# Patient Record
Sex: Male | Born: 1995 | Race: White | Hispanic: No | Marital: Married | Smoking: Never smoker
Health system: Southern US, Community
[De-identification: ages and names within clinical notes are randomized; demographics above are authoritative.]

## PROBLEM LIST (undated history)

## (undated) DIAGNOSIS — Z9109 Other allergy status, other than to drugs and biological substances: Secondary | ICD-10-CM

## (undated) DIAGNOSIS — G43909 Migraine, unspecified, not intractable, without status migrainosus: Secondary | ICD-10-CM

## (undated) HISTORY — PX: ADENOIDECTOMY: SHX5191

---
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
## 2018-12-30 ENCOUNTER — Other Ambulatory Visit: Payer: Self-pay

## 2018-12-30 DIAGNOSIS — Z20822 Contact with and (suspected) exposure to covid-19: Secondary | ICD-10-CM

## 2019-01-01 LAB — NOVEL CORONAVIRUS, NAA: SARS-CoV-2, NAA: NOT DETECTED

## 2019-05-31 ENCOUNTER — Telehealth: Payer: Self-pay | Admitting: Internal Medicine

## 2019-05-31 NOTE — Telephone Encounter (Signed)
Patient's Grandfather called today He stated that the patient is needing to est care with a pcp. Patient is having ear pain and would like to be worked in as soon as he could.   Could either of you work the patient in? Right now it looks like next opening is about the 2nd - 3rd week in April

## 2019-05-31 NOTE — Telephone Encounter (Signed)
Okay to put him in anytime this week.

## 2019-06-03 ENCOUNTER — Ambulatory Visit (INDEPENDENT_AMBULATORY_CARE_PROVIDER_SITE_OTHER): Payer: Managed Care, Other (non HMO) | Admitting: Primary Care

## 2019-06-03 ENCOUNTER — Encounter: Payer: Self-pay | Admitting: Primary Care

## 2019-06-03 ENCOUNTER — Other Ambulatory Visit: Payer: Self-pay

## 2019-06-03 VITALS — BP 106/70 | HR 71 | Temp 97.1°F | Ht 70.75 in | Wt 138.5 lb

## 2019-06-03 DIAGNOSIS — H9202 Otalgia, left ear: Secondary | ICD-10-CM | POA: Diagnosis not present

## 2019-06-03 DIAGNOSIS — G43709 Chronic migraine without aura, not intractable, without status migrainosus: Secondary | ICD-10-CM

## 2019-06-03 DIAGNOSIS — G43909 Migraine, unspecified, not intractable, without status migrainosus: Secondary | ICD-10-CM | POA: Insufficient documentation

## 2019-06-03 MED ORDER — FLUTICASONE PROPIONATE 50 MCG/ACT NA SUSP: 1.0000 | Freq: Two times a day (BID) | NASAL | 0 refills | Status: DC

## 2019-06-03 NOTE — Assessment & Plan Note (Signed)
Chronic, occurs several times monthly. Resolve with Excedrin Migraine. Offered preventative treatment for which he kindly declines. He will update.

## 2019-06-03 NOTE — Assessment & Plan Note (Signed)
Acute for the last 3 weeks. Exam today without evidence of infection, cerumen impaction, ruptured TM. Cloudy appearance, likely more allergy related.  Discussed use of Flonase, Zyrtec. He will update.  Consider ENT evaluation if no improvement.

## 2019-06-03 NOTE — Patient Instructions (Signed)
Nasal Congestion/Ear Pressure/Sinus Pressure: Try using Flonase (fluticasone) nasal spray. Instill 1 spray in each nostril twice daily.   Start Zyrtec once daily for throat drainage/mucous production.  It was a pleasure to meet you today! Please don't hesitate to call or message me with any questions. Welcome to [facility]!
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
## 2019-09-05 ENCOUNTER — Encounter: Payer: Managed Care, Other (non HMO) | Admitting: Primary Care

## 2020-03-31 ENCOUNTER — Telehealth: Payer: Managed Care, Other (non HMO) | Admitting: Nurse Practitioner

## 2020-04-02 ENCOUNTER — Telehealth: Payer: Self-pay

## 2020-04-02 NOTE — Telephone Encounter (Signed)
Called and spoke with patient who stated that he went and got a covid test, which was negative. Patient states that he is feeling much better now and is not having any symptoms. Informed patient to call office back if he needs any further assistance. Patient verbalized understanding.

## 2020-04-02 NOTE — Telephone Encounter (Signed)
Noted, glad to know he's doing better. 

## 2020-04-02 NOTE — Telephone Encounter (Signed)
[facility] Night - Client TELEPHONE ADVICE RECORD [facility] Patient Name: Ryan Potter Gender: Male DOB: Oct 10, 1995 Age: 25 Y 9 M 16 D Return Phone Number: [phone] (Primary) Address[place] [address] Client [facility] Night - Client Client Site [facility] - Night Physician Tillman Abide- MD Contact Type Call Who Is Calling Patient / Member / Family / Caregiver Call Type Triage / Clinical Return Phone Number [phone] (Primary) Chief Complaint Headache Reason for Call Medication Question / Request Initial Comment Caller states his grandson has Sx of fever of 102, body aches, nausea, headache and fatigue. Nurse Assessment Nurse: Annye English RN, Angelique Blonder Date/Time (Eastern Time): 03/31/2020 9:11:33 AM Confirm and document reason for call. If symptomatic, describe symptoms. ---Caller states his grandson has Sx of fever of 102, body aches, nausea, headache and fatigue. Took Tylenol 15 mins ago. Does the patient have any new or worsening symptoms? ---Yes Will a triage be completed? ---Yes Related visit to physician within the last 2 weeks? ---No Does the PT have any chronic conditions? (i.e. diabetes, asthma, this includes High risk factors for pregnancy, etc.) ---No Is this a behavioral health or substance abuse call? ---No Guidelines Guideline Title Affirmed Question Affirmed Notes Nurse Date/Time (Eastern Time) COVID-19 - Diagnosed or Suspected [1] COVID-19 infection suspected by caller or triager AND [2] mild symptoms (cough, fever, or others) AND [3] has not gotten tested yet Annye English, RN, [facility] 03/31/2020 9:12:25 AM Disp. Time Lamount Cohen Time) Disposition Final User 03/31/2020 9:18:04 AM Call PCP when Office is Open Yes Carmon, RN, Leighton Ruff Disagree/Comply Comply Caller Understands Yes PreDisposition Call Doctor Care Advice Given Per Guideline PLEASE NOTE: All  timestamps contained within this report are represented as [place] Standard Time. CONFIDENTIALTY NOTICE: This fax transmission is intended only for the addressee. It contains information that is legally privileged, confidential or otherwise protected from use or disclosure. If you are not the intended recipient, you are strictly prohibited from reviewing, disclosing, copying using or disseminating any of this information or taking any action in reliance on or regarding this information. If you have received this fax in error, please notify us immediately by telephone so that we can arrange for its return to [place]. Phone: [phone], Toll-Free: [phone], Fax: [phone] Page: 2 of 2 Call Id: [id number] Care Advice Given Per Guideline CALL PCP WHEN OFFICE IS OPEN: ALTERNATE DISPOSITION - LOCAL CLINIC OR URGENT CARE CENTER: * Many clinics, retail clinics (such as [facility] or [facility]), and urgent care centers perform COVID-19 testing. COVID-19 - WHO NEEDS TESTING? * SYMPTOMS: All people who have symptoms of COVID-19 should get tested WITHIN 3 DAYS of becoming ill. * Your doctor (or NP/PA) can order a COVID-19 test for you. COVID-19 - WHERE TO GO FOR TESTING? * Many clinics, retail clinics (such as [facility], [facility]), and urgent care centers perform testing. * Testing is also available at some local and state public health departments. GENERAL CARE ADVICE FOR COVID-19 SYMPTOMS: * The treatment is the same whether you have COVID-19, influenza or some other respiratory virus. * Cough: Use cough drops. * Feeling dehydrated: Drink extra liquids. If the air in your home is dry, use a humidifier. * Fever: For fever over 101 F (38.3 C), take acetaminophen every 4 to 6 hours (Adults 650 mg) OR ibuprofen every 6 to 8 hours (Adults 400 mg). Before taking any medicine, read all the instructions on the package. Do not take aspirin unless your doctor has prescribed  it for you. * Sore throat: Try throat lozenges, hard  candy or warm chicken broth. * Muscle aches, headache, and other pains: Often this comes and goes with the fever. Take acetaminophen every 4 to 6 hours (Adults 650 mg) OR ibuprofen every 6 to 8 hours (Adults 400 mg). Before taking any medicine, read all the instructions on the package. HOW TO PROTECT OTHERS - WHEN YOU ARE SICK WITH COVID-19: * STAY HOME A MINIMUM OF 10 DAYS: Home isolation is needed for at least 10 days after the symptoms started. Stay home from school or work if you are sick. Do NOT go to religious services, child care centers, shopping, or other public places. Do NOT use public transportation (e.g., bus, taxis, ridesharing). Do NOT allow any visitors to your home. Leave the house only if you need to seek urgent medical care. * COVER THE COUGH: Cough and sneeze into your shirt sleeve or inner elbow. Don't cough into your hand or the air. If available, cough into a tissue and throw it into a trash can. * WASH HANDS OFTEN: Wash hands often with soap and water. After coughing or sneezing are important times. If soap and water are not available, use an alcohol-based hand sanitizer with at least 60% alcohol, covering all surfaces of your hands and rubbing them together until they feel dry. Avoid touching your eyes, nose, and mouth with unwashed hands. * WEAR A MASK: Wear a facemask when around others. Always wear a facemask (if available) if you have to leave your home (such as going to a medical facility). CALL BACK IF: * Chest pain or difficulty breathing occurs * You become worse CARE ADVICE given per COVID-19 - DIAGNOSED OR SUSPECTED (Adult) guideline.

## 2022-11-09 ENCOUNTER — Emergency Department (HOSPITAL_COMMUNITY)
Admission: EM | Admit: 2022-11-09 | Discharge: 2022-11-09 | Disposition: A | Discharge status: Home / Self Care | Payer: Self-pay | Attending: Emergency Medicine | Admitting: Emergency Medicine

## 2022-11-09 ENCOUNTER — Other Ambulatory Visit: Payer: Self-pay

## 2022-11-09 ENCOUNTER — Emergency Department (HOSPITAL_COMMUNITY): Payer: Self-pay

## 2022-11-09 ENCOUNTER — Encounter (HOSPITAL_COMMUNITY): Payer: Self-pay

## 2022-11-09 DIAGNOSIS — S40012A Contusion of left shoulder, initial encounter: Secondary | ICD-10-CM | POA: Insufficient documentation

## 2022-11-09 DIAGNOSIS — Y9351 Activity, roller skating (inline) and skateboarding: Secondary | ICD-10-CM | POA: Insufficient documentation

## 2022-11-09 DIAGNOSIS — T07XXXA Unspecified multiple injuries, initial encounter: Secondary | ICD-10-CM

## 2022-11-09 DIAGNOSIS — S0511XA Contusion of eyeball and orbital tissues, right eye, initial encounter: Secondary | ICD-10-CM | POA: Insufficient documentation

## 2022-11-09 DIAGNOSIS — W19XXXA Unspecified fall, initial encounter: Secondary | ICD-10-CM

## 2022-11-09 LAB — BASIC METABOLIC PANEL
Anion gap: 12 (ref 5–15)
BUN: 13 mg/dL (ref 6–20)
CO2: 23 mmol/L (ref 22–32)
Calcium: 9 mg/dL (ref 8.9–10.3)
Chloride: 100 mmol/L (ref 98–111)
Creatinine, Ser: 1.06 mg/dL (ref 0.61–1.24)
GFR, Estimated: >60 mL/min (ref >60)
Glucose, Bld: 92 mg/dL (ref 70–99)
Potassium: 3.4 mmol/L — ABNORMAL LOW (ref 3.5–5.1)
Sodium: 135 mmol/L (ref 135–145)

## 2022-11-09 LAB — CBC WITH DIFFERENTIAL/PLATELET
Abs Immature Granulocytes: 0.03 10*3/uL (ref 0.00–0.07)
Basophils Absolute: 0.1 10*3/uL (ref 0.0–0.1)
Basophils Relative: 0 %
Eosinophils Absolute: 0.1 10*3/uL (ref 0.0–0.5)
Eosinophils Relative: 1 %
HCT: 40.6 % (ref 39.0–52.0)
Hemoglobin: 13.6 g/dL (ref 13.0–17.0)
Immature Granulocytes: 0 %
Lymphocytes Relative: 14 %
Lymphs Abs: 1.7 10*3/uL (ref 0.7–4.0)
MCH: 30.3 pg (ref 26.0–34.0)
MCHC: 33.5 g/dL (ref 30.0–36.0)
MCV: 90.4 fL (ref 80.0–100.0)
Monocytes Absolute: 0.9 10*3/uL (ref 0.1–1.0)
Monocytes Relative: 7 %
Neutro Abs: 9.6 10*3/uL — ABNORMAL HIGH (ref 1.7–7.7)
Neutrophils Relative %: 78 %
Platelets: 221 10*3/uL (ref 150–400)
RBC: 4.49 MIL/uL (ref 4.22–5.81)
RDW: 12 % (ref 11.5–15.5)
WBC: 12.3 10*3/uL — ABNORMAL HIGH (ref 4.0–10.5)
nRBC: 0 % (ref 0.0–0.2)

## 2022-11-09 MED ORDER — ACETAMINOPHEN 500 MG PO TABS
1000.0000 mg | ORAL_TABLET | Freq: Once | ORAL | Status: AC
  Administered 2022-11-09: 1000 mg via ORAL
  Filled 2022-11-09: qty 2

## 2022-11-09 MED ORDER — CYCLOBENZAPRINE HCL 10 MG PO TABS: 10.0000 mg | ORAL_TABLET | Freq: Two times a day (BID) | ORAL | 0 refills | Status: DC | PRN

## 2022-11-09 MED ORDER — IBUPROFEN 400 MG PO TABS
400.0000 mg | ORAL_TABLET | Freq: Once | ORAL | Status: AC
  Administered 2022-11-09: 400 mg via ORAL
  Filled 2022-11-09: qty 1

## 2022-11-09 NOTE — ED Provider Notes (Addendum)
EMERGENCY DEPARTMENT AT [facility] Provider Note   CSN: [id number] Arrival date & time: 11/09/22  1555     History  Chief Complaint  Patient presents with   Ryan Potter is a 27 y.o. male.  Patient is a healthy 27 year old male presenting today with significant pain in the left shoulder after a skateboard accident prior to arrival.  He was on his board and fell backwards and the board flipped around and hit him in the right side of his face.  His biggest complaint is pain in the posterior left shoulder.  It is painful with any movements.  He denies any numbness or tingling in the hand.  He also has road rash on bilateral shoulder blades but reports his right side feels okay.  He denies loss of consciousness or hitting his head.  He has no rib pain or difficulty breathing.  He denies any neck pain.  He was able to ambulate without difficulty.  The history is provided by the patient.  Fall       Home Medications Prior to Admission medications   Medication Sig Start Date End Date Taking? Authorizing Provider  cetirizine (ZYRTEC) 10 MG tablet Take 10 mg by mouth daily.    [provider]  fluticasone (FLONASE) 50 MCG/ACT nasal spray Place 1 spray into both nostrils 2 (two) times daily. 06/03/19   Doreene Nest, NP  ibuprofen (ADVIL,MOTRIN) 200 MG tablet Take 400 mg by mouth every 6 (six) hours as needed for pain. For headache    [provider]      Allergies    Sudafed [pseudoephedrine hcl]    Review of Systems   Review of Systems  Physical Exam Updated Vital Signs BP 120/74   Pulse 74   Temp 98.3 F (36.8 C)   Resp 18   Ht 5\' 10"  (1.778 m)   Wt 61.2 kg   SpO2 100%   BMI 19.37 kg/m  Physical Exam Vitals and nursing note reviewed.  Constitutional:      General: He is not in acute distress.    Appearance: He is well-developed.  HENT:     Head: Normocephalic. Contusion present.   Eyes:      Conjunctiva/sclera: Conjunctivae normal.     Pupils: Pupils are equal, round, and reactive to light.  Cardiovascular:     Rate and Rhythm: Normal rate and regular rhythm.     Heart sounds: No murmur heard. Pulmonary:     Effort: Pulmonary effort is normal. No respiratory distress.     Breath sounds: Normal breath sounds. No wheezing or rales.  Chest:     Chest wall: No tenderness.  Abdominal:     General: There is no distension.     Palpations: Abdomen is soft.     Tenderness: There is no abdominal tenderness. There is no guarding or rebound.  Musculoskeletal:        General: No tenderness. Normal range of motion.       Arms:     Cervical back: Normal range of motion and neck supple.  Skin:    General: Skin is warm and dry.     Findings: No erythema or rash.  Neurological:     Mental Status: He is alert and oriented to person, place, and time.  Psychiatric:        Behavior: Behavior normal.     ED Results / Procedures / Treatments   Labs (all labs ordered  are listed, but only abnormal results are displayed) Labs Reviewed  CBC WITH DIFFERENTIAL/PLATELET - Abnormal; Notable for the following components:      Result Value   WBC 12.3 (*)    Neutro Abs 9.6 (*)    All other components within normal limits  BASIC METABOLIC PANEL - Abnormal; Notable for the following components:   Potassium 3.4 (*)    All other components within normal limits    EKG None  Radiology DG Ribs Unilateral W/Chest Left  Result Date: 11/09/2022 CLINICAL DATA:  Fall with left shoulder pain. EXAM: LEFT RIBS AND CHEST - 3+ VIEW COMPARISON:  Chest radiograph dated 09/19/2012. FINDINGS: No fracture or other bone lesions are seen involving the ribs. There is no evidence of pneumothorax or pleural effusion. Both lungs are clear. Heart size and mediastinal contours are within normal limits. IMPRESSION: Negative. Electronically Signed   By: Romona Curls M.D.   On: 11/09/2022 17:38   DG Shoulder  Left  Result Date: 11/09/2022 CLINICAL DATA:  Fall with left shoulder pain. EXAM: LEFT SHOULDER - 2+ VIEW COMPARISON:  Chest radiograph dated 09/19/2012. FINDINGS: There is no evidence of fracture or dislocation. There is no evidence of arthropathy or other focal bone abnormality. Soft tissues are unremarkable. IMPRESSION: Negative. Electronically Signed   By: Romona Curls M.D.   On: 11/09/2022 17:38    Procedures Procedures    Medications Ordered in ED Medications  ibuprofen (ADVIL) tablet 400 mg (400 mg Oral Given 11/09/22 1753)  acetaminophen (TYLENOL) tablet 1,000 mg (1,000 mg Oral Given 11/09/22 1753)    ED Course/ Medical Decision Making/ A&P                                 Medical Decision Making Amount and/or Complexity of Data Reviewed Labs: ordered. Decision-making details documented in ED Course. Radiology: ordered and independent interpretation performed. Decision-making details documented in ED Course.  Risk OTC drugs. Prescription drug management.   Patient presenting today after a skateboard accident.  Only complaint is pain in the posterior left shoulder.  No obvious deformities.  Neurovascularly intact.  Patient denies hitting his head loss of consciousness or neck pain.  He does have some bruising around his right eye but denies tenderness with palpation reports his vision is normal.  I have independently visualized and interpreted pt's images today. X-rays today are negative for rib fracture and left shoulder imaging is negative for dislocation or fracture.  I independently interpreted patient's labs and CBC with mild leukocytosis of 12 but otherwise no acute findings. will place patient in a sling for comfort was given Tylenol ibuprofen.  Stable for discharge at this time.         Final Clinical Impression(s) / ED Diagnoses Final diagnoses:  Fall, initial encounter  Contusion of left shoulder, initial encounter  Abrasions of multiple sites  Traumatic  ecchymosis of right orbital rim, initial encounter    Rx / DC Orders ED Discharge Orders     None         Gwyneth Sprout, MD 11/09/22 Gracy Bruins, MD 11/09/22 1836

## 2022-11-09 NOTE — ED Notes (Signed)
Pt in bed connected to monitor

## 2022-11-09 NOTE — Progress Notes (Signed)
Orthopedic Tech Progress Note Patient Details:  Ryan Potter [name] 10-11-1995 [id number]  Ortho Devices Type of Ortho Device: Arm sling Ortho Device/Splint Location: LUE Ortho Device/Splint Interventions: Ordered, Application, Adjustment   Post Interventions Patient Tolerated: Well Instructions Provided: Care of device, Adjustment of device  [name] [name] 11/09/2022, 6:33 PM

## 2022-11-09 NOTE — Discharge Instructions (Signed)
There is no broken bones today and your x-rays were normal but you are very bruised and probably sprained your shoulder.  You can wear the sling for comfort but make sure you are ranging your arm multiple times a day.  Take 2 Tylenol and 2 ibuprofen together every 6 hours as needed for pain

## 2022-11-09 NOTE — ED Triage Notes (Signed)
Pt arrives with c/o left shoulder pain  and right pain after he fell off his skateboard. No obvious deformity to left shoulder, but limited ROM. Pt has bruising and abrasion to right. Pt denies vision changes. Pts skateboard flew up and  hit him in his eye. Pt ambulatory to triage.

## 2024-01-22 ENCOUNTER — Encounter (HOSPITAL_COMMUNITY): Payer: Self-pay | Admitting: *Deleted

## 2024-01-22 ENCOUNTER — Other Ambulatory Visit: Payer: Self-pay

## 2024-01-22 ENCOUNTER — Emergency Department (HOSPITAL_COMMUNITY)
Admission: EM | Admit: 2024-01-22 | Discharge: 2024-01-23 | Disposition: A | Discharge status: Home / Self Care | Payer: Self-pay | Attending: Emergency Medicine | Admitting: Emergency Medicine

## 2024-01-22 DIAGNOSIS — R1033 Periumbilical pain: Secondary | ICD-10-CM | POA: Insufficient documentation

## 2024-01-22 LAB — CBC WITH DIFFERENTIAL/PLATELET
Abs Immature Granulocytes: 0.03 K/uL (ref 0.00–0.07)
Basophils Absolute: 0.1 K/uL (ref 0.0–0.1)
Basophils Relative: 1 %
Eosinophils Absolute: 0.1 K/uL (ref 0.0–0.5)
Eosinophils Relative: 1 %
HCT: 44 % (ref 39.0–52.0)
Hemoglobin: 14.8 g/dL (ref 13.0–17.0)
Immature Granulocytes: 0 %
Lymphocytes Relative: 24 %
Lymphs Abs: 2.3 K/uL (ref 0.7–4.0)
MCH: 30.7 pg (ref 26.0–34.0)
MCHC: 33.6 g/dL (ref 30.0–36.0)
MCV: 91.3 fL (ref 80.0–100.0)
Monocytes Absolute: 0.6 K/uL (ref 0.1–1.0)
Monocytes Relative: 6 %
Neutro Abs: 6.7 K/uL (ref 1.7–7.7)
Neutrophils Relative %: 68 %
Platelets: 229 K/uL (ref 150–400)
RBC: 4.82 MIL/uL (ref 4.22–5.81)
RDW: 11.8 % (ref 11.5–15.5)
WBC: 9.9 K/uL (ref 4.0–10.5)
nRBC: 0 % (ref 0.0–0.2)

## 2024-01-22 LAB — URINALYSIS, ROUTINE W REFLEX MICROSCOPIC
Bacteria, UA: NONE SEEN
Bilirubin Urine: NEGATIVE
Glucose, UA: NEGATIVE mg/dL
Ketones, ur: NEGATIVE mg/dL
Leukocytes,Ua: NEGATIVE
Nitrite: NEGATIVE
Protein, ur: NEGATIVE mg/dL
Specific Gravity, Urine: 1.02 (ref 1.005–1.030)
pH: 5 (ref 5.0–8.0)

## 2024-01-22 LAB — COMPREHENSIVE METABOLIC PANEL WITH GFR
ALT: 11 U/L (ref 0–44)
AST: 18 U/L (ref 15–41)
Albumin: 4.9 g/dL (ref 3.5–5.0)
Alkaline Phosphatase: 48 U/L (ref 38–126)
Anion gap: 12 (ref 5–15)
BUN: 14 mg/dL (ref 6–20)
CO2: 27 mmol/L (ref 22–32)
Calcium: 9.6 mg/dL (ref 8.9–10.3)
Chloride: 99 mmol/L (ref 98–111)
Creatinine, Ser: 1.07 mg/dL (ref 0.61–1.24)
GFR, Estimated: >60 mL/min (ref >60)
Glucose, Bld: 93 mg/dL (ref 70–99)
Potassium: 3.8 mmol/L (ref 3.5–5.1)
Sodium: 138 mmol/L (ref 135–145)
Total Bilirubin: 0.7 mg/dL (ref 0.0–1.2)
Total Protein: 7.8 g/dL (ref 6.5–8.1)

## 2024-01-22 LAB — LIPASE, BLOOD: Lipase: 28 U/L (ref 11–51)

## 2024-01-22 NOTE — ED Triage Notes (Signed)
 Patient states that a couples weeks ago he stretched a certain way and he felt like something stabbed him in the belly button. He reports pain at his belly button that is getting worse. Denies nausea vomiting and diarrhea.

## 2024-01-22 NOTE — ED Provider Triage Note (Signed)
 Emergency Medicine Provider Triage Evaluation Note  Tarris Delbene [facility] , a 28 y.o. male  was evaluated in triage.  Pt complains of abd pain. Umbilical abdominal pain ongoing x 3 weeks after he think he may have stretched it while stretching.  Occasional fluid oozing out from it.  No fever, chills, n/v/d, dysuria, hematuria  Review of Systems  Positive: As above Negative: As above  Physical Exam  BP 134/77 (BP Location: Right Arm)   Pulse 75   Temp 98.6 F (37 C)   Resp 20   Ht 5' 10 (1.778 m)   Wt 61.2 kg   SpO2 100%   BMI 19.36 kg/m  Gen:   Awake, no distress   Resp:  Normal effort  MSK:   Moves extremities without difficulty  Other:    Medical Decision Making  Medically screening exam initiated at 7:45 PM.  Appropriate orders placed.  Wake Conlee Carline was informed that the remainder of the evaluation will be completed by another provider, this initial triage assessment does not replace that evaluation, and the importance of remaining in the ED until their evaluation is complete.     Nivia Colon, PA-C 01/22/24 1946

## 2024-01-23 MED ORDER — DOXYCYCLINE HYCLATE 100 MG PO CAPS: 100.0000 mg | ORAL_CAPSULE | Freq: Two times a day (BID) | ORAL | 0 refills | Status: AC

## 2024-01-23 MED ORDER — NAPROXEN 250 MG PO TABS
500.0000 mg | ORAL_TABLET | Freq: Once | ORAL | Status: AC
  Administered 2024-01-23: 500 mg via ORAL
  Filled 2024-01-23: qty 2

## 2024-01-23 MED ORDER — DOXYCYCLINE HYCLATE 100 MG PO TABS
100.0000 mg | ORAL_TABLET | Freq: Once | ORAL | Status: AC
  Administered 2024-01-23: 100 mg via ORAL
  Filled 2024-01-23: qty 1

## 2024-01-23 MED ORDER — NAPROXEN 500 MG PO TABS: 500.0000 mg | ORAL_TABLET | Freq: Two times a day (BID) | ORAL | 0 refills | Status: AC | PRN

## 2024-01-23 NOTE — ED Provider Notes (Signed)
 [place] EMERGENCY DEPARTMENT AT [facility] Provider Note   CSN: [id number] Arrival date & time: 01/22/24  8076     Patient presents with: No chief complaint on file.   Ryan Potter is a 28 y.o. male.   28 year old male with a history of migraines presents to the emergency department for evaluation of pain at his umbilicus.  He was sleeping in a prone position when he reached out in front of him to stretch and felt a sharp pain at his umbilicus.  This pain has continued and is mostly aggravated with palpation.  Does report occasional fluid oozing from the area.  He has not had fevers, chills, nausea, vomiting, diarrhea, dysuria, hematuria, abdominal distention or masses.  No history of abdominal surgeries.  The history is provided by the patient. No language interpreter was used.       Prior to Admission medications   Medication Sig Start Date End Date Taking? Authorizing Provider  doxycycline (VIBRAMYCIN) 100 MG capsule Take 1 capsule (100 mg total) by mouth 2 (two) times daily. 01/23/24  Yes Keith Sor, PA-C  naproxen (NAPROSYN) 500 MG tablet Take 1 tablet (500 mg total) by mouth every 12 (twelve) hours as needed for mild pain (pain score 1-3) or moderate pain (pain score 4-6). 01/23/24  Yes Keith Sor, PA-C  cetirizine (ZYRTEC) 10 MG tablet Take 10 mg by mouth daily.    [provider]    Allergies: Sudafed [pseudoephedrine hcl]    Review of Systems Ten systems reviewed and are negative for acute change, except as noted in the HPI.    Updated Vital Signs BP 111/74 (BP Location: Left Arm)   Pulse (!) 55   Temp 98.6 F (37 C)   Resp 16   Ht 5' 10 (1.778 m)   Wt 61.2 kg   SpO2 100%   BMI 19.36 kg/m   Physical Exam Vitals and nursing note reviewed.  Constitutional:      General: He is not in acute distress.    Appearance: He is well-developed. He is not diaphoretic.     Comments: Nontoxic appearing. Multiple tattoos.  HENT:      Head: Normocephalic and atraumatic.  Eyes:     General: No scleral icterus.    Conjunctiva/sclera: Conjunctivae normal.  Pulmonary:     Effort: Pulmonary effort is normal. No respiratory distress.     Comments: Respirations even and unlabored Abdominal:     Palpations: Abdomen is soft. There is no mass.     Tenderness: There is abdominal tenderness (point tender at umbilicus.).     Hernia: No hernia is present.     Comments: Mild erythema of the umbilicus with point tenderness. No active drainage or purulence. No surrounding induration, streaking. Abdomen, otherwise, is nontender, nondistended. No peritoneal signs or palpable masses. No appreciable hernia.  Musculoskeletal:        General: Normal range of motion.     Cervical back: Normal range of motion.  Skin:    General: Skin is warm and dry.     Coloration: Skin is not pale.     Findings: No erythema or rash.  Neurological:     Mental Status: He is alert and oriented to person, place, and time.  Psychiatric:        Behavior: Behavior normal.     (all labs ordered are listed, but only abnormal results are displayed) Labs Reviewed  URINALYSIS, ROUTINE W REFLEX MICROSCOPIC - Abnormal; Notable for the following components:  Result Value   Hgb urine dipstick LARGE (*)    All other components within normal limits  CBC WITH DIFFERENTIAL/PLATELET  COMPREHENSIVE METABOLIC PANEL WITH GFR  LIPASE, BLOOD    EKG: None  Radiology: No results found.   Procedures   Medications Ordered in the ED  naproxen (NAPROSYN) tablet 500 mg (has no administration in time range)  doxycycline (VIBRA-TABS) tablet 100 mg (has no administration in time range)                                    Medical Decision Making Risk Prescription drug management.   This patient presents to the ED for concern of umbilical pain, this involves an extensive number of treatment options, and is a complaint that carries with it a high risk of  complications and morbidity.  The differential diagnosis includes skin/soft tissue infection vs abscess vs hernia vs MSK strain vs appendicitis vs viral illness vs ruptured viscous.   Co morbidities that complicate the patient evaluation  Migraines   Additional history obtained:  External records from outside source obtained and reviewed including prior discharge summaries   Lab Tests:  I Ordered, and personally interpreted labs.  The pertinent results include:  Normal CBC, CMP, lipase. UA with 11-20 RBCs.   Cardiac Monitoring:  The patient was maintained on a cardiac monitor.  I personally viewed and interpreted the cardiac monitored which showed an underlying rhythm of: NSR   Medicines ordered and prescription drug management:  I ordered medication including doxycycline for presumed infection, Naproxen for pain  I have reviewed the patients home medicines and have made adjustments as needed   Test Considered:  CT abd/pelvis - felt low yield given reassuring labs, symptom chronicity.   Problem List / ED Course:  Patient presenting for 3 weeks of pain at his umbilicus.  He is specifically point tender in this location, though there is no distortion of the umbilicus; no masses, swelling, fluctuance or surrounding induration.  Umbilicus, itself, is mildly erythematous.  There is no active drainage, though patient reports some oozing from the area at times.  Clinically, there is concern for skin/soft tissue infection.  Will place patient on course of doxycycline.  No discrete drainable abscess noted.  No concern for strangulated or incarcerated hernia.  Screening labs completed in triage and are reassuring.   Reevaluation:  After the interventions noted above, I reevaluated the patient and found that they have :stayed the same   Social Determinants of Health:  Uninsured patient   Dispostion:  After consideration of the diagnostic results and the patients response to  treatment, I feel that the patent would benefit from PCP f/u for recheck. Given doxycycline course given suspicion for infection. Return precautions discussed and provided. Patient discharged in stable condition with no unaddressed concerns.       Final diagnoses:  Umbilical pain    ED Discharge Orders          Ordered    doxycycline (VIBRAMYCIN) 100 MG capsule  2 times daily        01/23/24 0212    naproxen (NAPROSYN) 500 MG tablet  Every 12 hours PRN        01/23/24 0212               Keith Sor, PA-C 01/23/24 0238    Griselda Norris, MD 01/23/24 [phone]

## 2024-01-23 NOTE — Discharge Instructions (Addendum)
 Take doxycycline as prescribed for presumed infection. Take Naproxen for pain as prescribed, if needed. Follow up with a primary care doctor.
